# Patient Record
Sex: Male | Born: 1980
Health system: Northeastern US, Academic
[De-identification: ages and names within clinical notes are randomized; demographics above are authoritative.]

---

## 2010-10-05 IMAGING — CR RAD EXT HAND MIN 3 VIEW RIGHT
3 series · 3 of 3 positions shown · non-contrast
Comparison: none

[AP (1 of 2)]
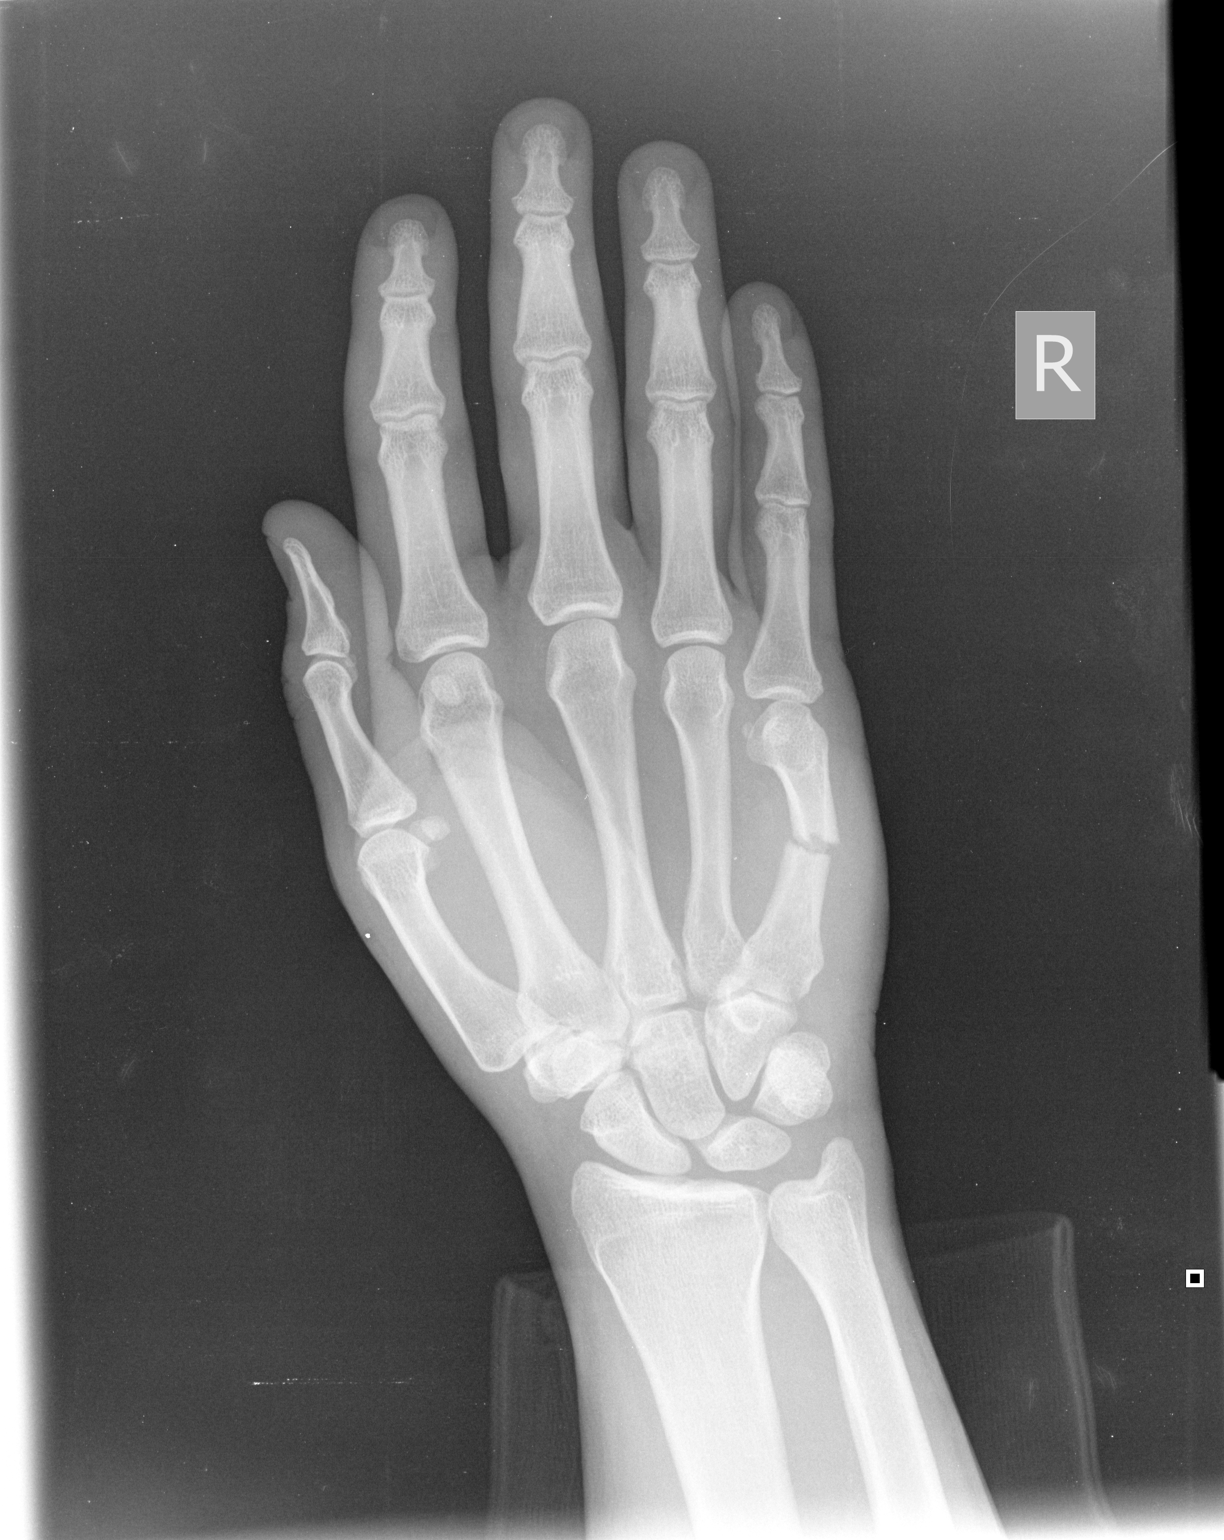

[AP (2 of 2)]
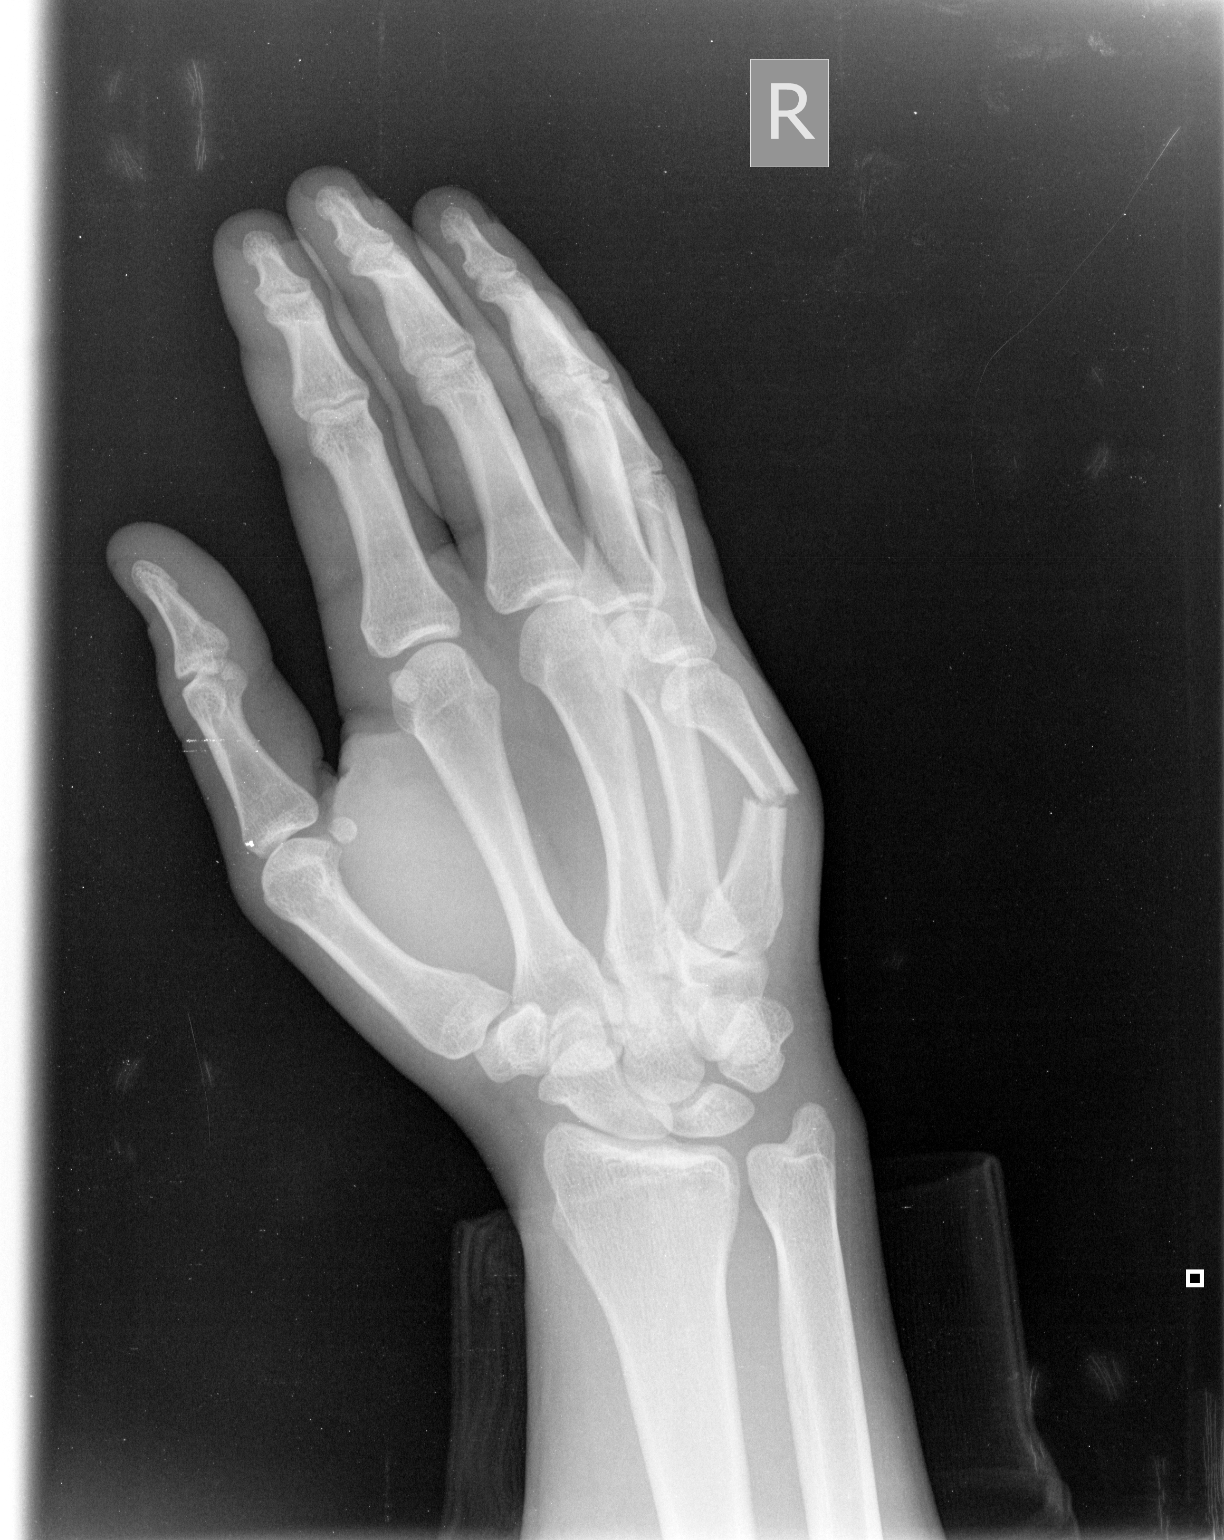

[left lateral]
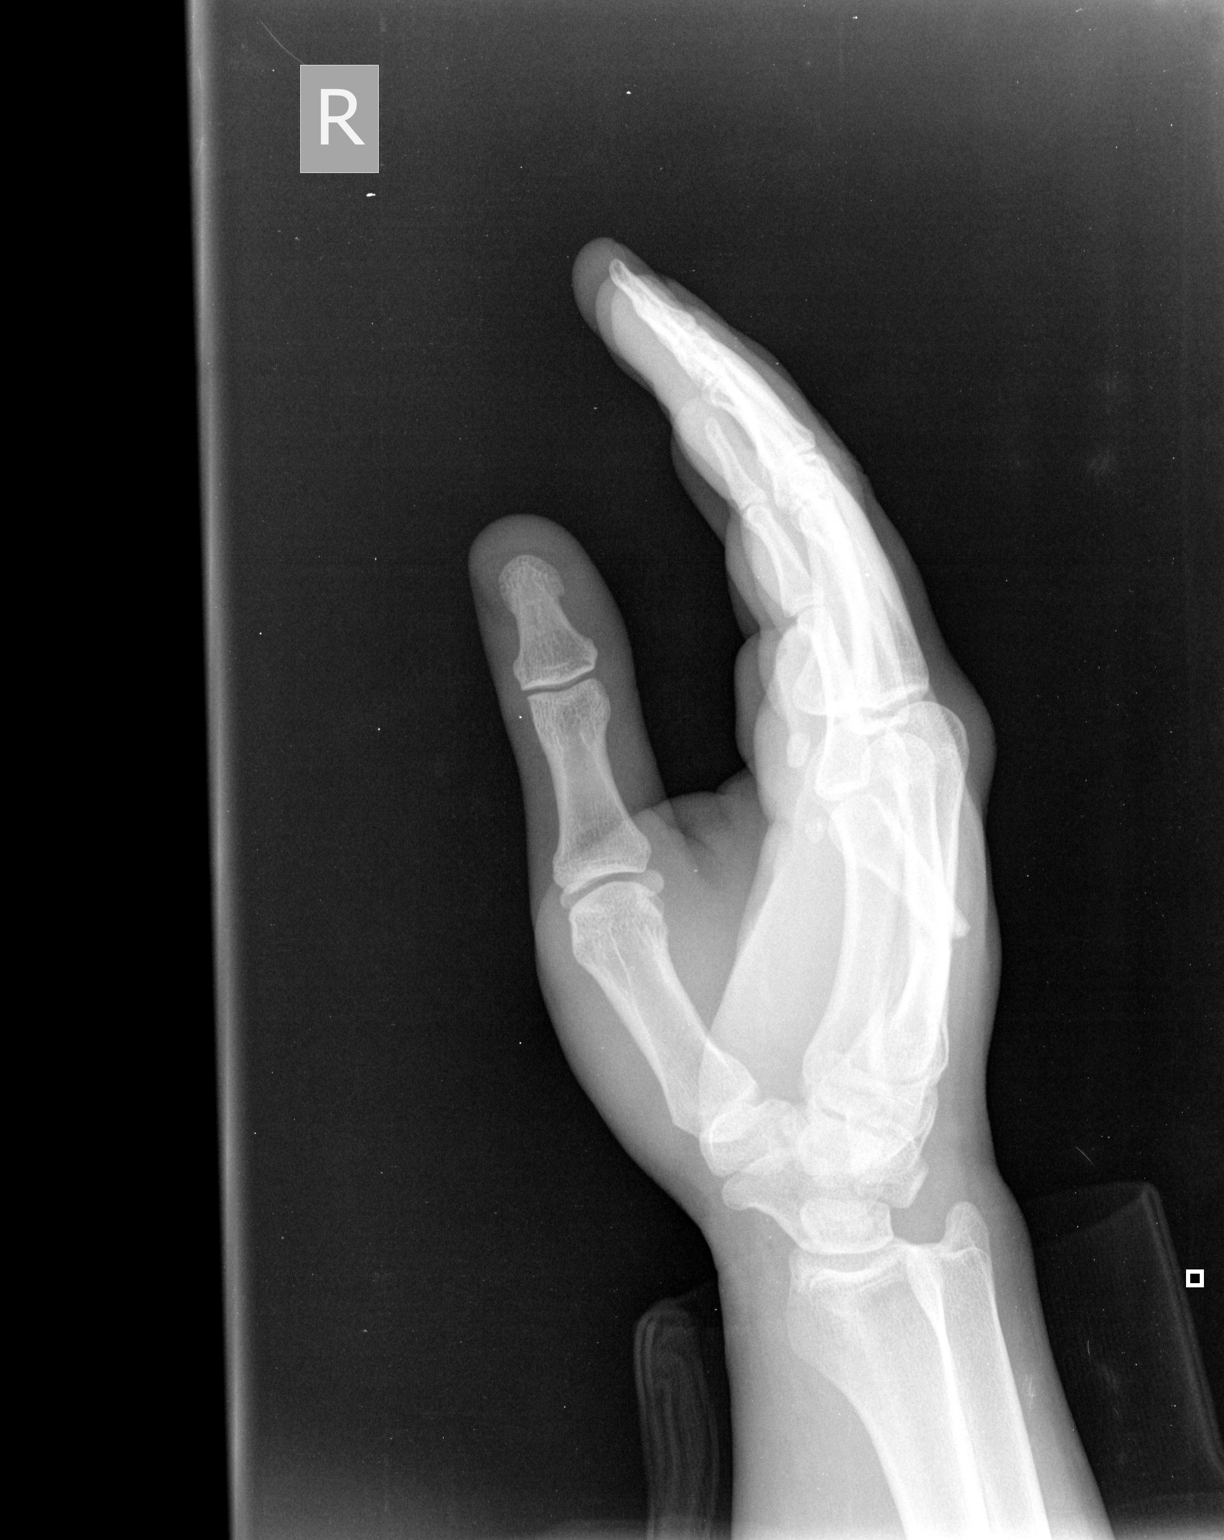

[3 of 3 positions shown; findings below may reference images not displayed]

EXAM   Right hand

HISTORY  Pain 

COMPARISON  None.

FINDINGS  Three views are submitted.   There is a transverse 

fracture through the fifth metacarpal with dorsal angulation and 

adjacent soft tissue swelling.  The remainder of the visualized bones 

are intact.

IMPRESSION

Angulated transverse fracture through the fifth metacarpal.

## 2020-01-12 ENCOUNTER — Encounter: Admit: 2020-01-12 | Payer: PRIVATE HEALTH INSURANCE

## 2020-01-12 ENCOUNTER — Encounter: Admit: 2020-01-12 | Payer: PRIVATE HEALTH INSURANCE | Attending: Vascular and Interventional Radiology

## 2020-01-12 DIAGNOSIS — S83242A Other tear of medial meniscus, current injury, left knee, initial encounter: Secondary | ICD-10-CM

## 2020-01-19 ENCOUNTER — Ambulatory Visit: Admit: 2020-01-19 | Payer: TRICARE (CHAMPUS)

## 2020-09-13 ENCOUNTER — Inpatient Hospital Stay: Admit: 2020-09-13 | Discharge: 2020-09-13 | Payer: TRICARE (CHAMPUS)

## 2020-09-13 DIAGNOSIS — Z20828 Contact with and (suspected) exposure to other viral communicable diseases: Secondary | ICD-10-CM

## 2020-09-13 DIAGNOSIS — U071 COVID-19: Secondary | ICD-10-CM

## 2020-09-15 ENCOUNTER — Encounter: Admit: 2020-09-15 | Payer: PRIVATE HEALTH INSURANCE

## 2020-09-15 LAB — COVID-19 CLEARANCE OR FOR PLACEMENT ONLY: BKR SARS-COV-2 RNA (COVID-19) (YH): DETECTED — AB

## 2020-09-15 NOTE — Other
Patient mychart message sent to follow-up on positive result.

## 2020-09-18 ENCOUNTER — Ambulatory Visit: Admit: 2020-09-18 | Payer: PRIVATE HEALTH INSURANCE

## 2020-10-28 ENCOUNTER — Inpatient Hospital Stay: Admit: 2020-10-28 | Discharge: 2020-10-28 | Payer: BLUE CROSS/BLUE SHIELD

## 2020-10-28 DIAGNOSIS — Z20828 Contact with and (suspected) exposure to other viral communicable diseases: Secondary | ICD-10-CM

## 2020-10-29 DIAGNOSIS — Z20822 Contact with and (suspected) exposure to covid-19: Secondary | ICD-10-CM

## 2020-10-29 LAB — COVID-19 CLEARANCE OR FOR PLACEMENT ONLY: BKR SARS-COV-2 RNA (COVID-19) (YH): NEGATIVE

## 2021-03-05 ENCOUNTER — Inpatient Hospital Stay: Admit: 2021-03-05 | Discharge: 2021-03-05 | Payer: PRIVATE HEALTH INSURANCE

## 2021-03-05 DIAGNOSIS — U071 COVID-19: Secondary | ICD-10-CM

## 2021-03-05 DIAGNOSIS — Z20828 Contact with and (suspected) exposure to other viral communicable diseases: Secondary | ICD-10-CM

## 2021-03-05 LAB — SARS COV-2 (COVID-19) RNA-~~LOC~~ LABS (BH GH LMW YH): BKR SARS-COV-2 RNA (COVID-19) (YH): POSITIVE — AB
